# Patient Record
Sex: Female | Born: 1980 | Race: Black or African American | Hispanic: No | Marital: Single | Smoking: Never smoker
Health system: Southern US, Community
[De-identification: ages and names within clinical notes are randomized; demographics above are authoritative.]

## PROBLEM LIST (undated history)

## (undated) DIAGNOSIS — Z923 Personal history of irradiation: Secondary | ICD-10-CM

## (undated) DIAGNOSIS — D649 Anemia, unspecified: Secondary | ICD-10-CM

## (undated) DIAGNOSIS — L919 Hypertrophic disorder of the skin, unspecified: Secondary | ICD-10-CM

## (undated) HISTORY — PX: OTHER SURGICAL HISTORY: SHX169

## (undated) HISTORY — DX: Personal history of irradiation: Z92.3

---

## 2003-03-19 ENCOUNTER — Encounter: Payer: Self-pay | Admitting: Emergency Medicine

## 2003-03-19 ENCOUNTER — Emergency Department (HOSPITAL_COMMUNITY): Admission: EM | Admit: 2003-03-19 | Discharge: 2003-03-19 | Payer: Self-pay | Admitting: Emergency Medicine

## 2003-04-08 ENCOUNTER — Encounter: Payer: Self-pay | Admitting: Emergency Medicine

## 2003-04-08 ENCOUNTER — Emergency Department (HOSPITAL_COMMUNITY): Admission: EM | Admit: 2003-04-08 | Discharge: 2003-04-08 | Payer: Self-pay

## 2004-01-11 ENCOUNTER — Emergency Department (HOSPITAL_COMMUNITY): Admission: EM | Admit: 2004-01-11 | Discharge: 2004-01-11 | Payer: Self-pay | Admitting: Family Medicine

## 2009-01-08 ENCOUNTER — Emergency Department (HOSPITAL_COMMUNITY): Admission: EM | Admit: 2009-01-08 | Discharge: 2009-01-08 | Payer: Self-pay | Admitting: Emergency Medicine

## 2011-09-04 ENCOUNTER — Emergency Department (INDEPENDENT_AMBULATORY_CARE_PROVIDER_SITE_OTHER): Payer: Self-pay

## 2011-09-04 ENCOUNTER — Encounter (HOSPITAL_COMMUNITY): Payer: Self-pay

## 2011-09-04 ENCOUNTER — Emergency Department (INDEPENDENT_AMBULATORY_CARE_PROVIDER_SITE_OTHER)
Admission: EM | Admit: 2011-09-04 | Discharge: 2011-09-04 | Disposition: A | Discharge status: Home Health | Payer: Self-pay | Source: Home / Self Care | Attending: Family Medicine | Admitting: Family Medicine

## 2011-09-04 DIAGNOSIS — S92919A Unspecified fracture of unspecified toe(s), initial encounter for closed fracture: Secondary | ICD-10-CM

## 2011-09-04 HISTORY — DX: Morbid (severe) obesity due to excess calories: E66.01
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
## 2011-09-04 NOTE — Discharge Instructions (Signed)
Wear shoe for comfort as long as needed, ice and advil as needed for soreness, activity as tolerated.
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
## 2012-08-07 ENCOUNTER — Ambulatory Visit: Payer: Self-pay

## 2012-08-07 ENCOUNTER — Institutional Professional Consult (permissible substitution): Payer: Self-pay | Admitting: Radiation Oncology

## 2012-08-12 ENCOUNTER — Ambulatory Visit
Admission: RE | Admit: 2012-08-12 | Discharge: 2012-08-12 | Disposition: A | Discharge status: Home / Self Care | Payer: BC Managed Care – PPO | Source: Ambulatory Visit | Attending: Radiation Oncology | Admitting: Radiation Oncology

## 2012-08-12 ENCOUNTER — Encounter: Payer: Self-pay | Admitting: Radiation Oncology

## 2012-08-12 VITALS — BP 155/105 | HR 84 | Temp 98.4°F | Ht 63.5 in | Wt 384.0 lb

## 2012-08-12 DIAGNOSIS — L919 Hypertrophic disorder of the skin, unspecified: Secondary | ICD-10-CM

## 2012-08-12 DIAGNOSIS — L910 Hypertrophic scar: Secondary | ICD-10-CM | POA: Insufficient documentation

## 2012-08-12 HISTORY — DX: Hypertrophic disorder of the skin, unspecified: L91.9

## 2012-08-12 NOTE — Addendum Note (Signed)
Encounter addended by: Marquez Ceesay Marie Bralyn Folkert, RN on: 08/12/2012  5:03 PM<BR>     Documentation filed: Charges VN

## 2012-08-12 NOTE — Addendum Note (Signed)
Encounter addended by: Tessa Lerner, RN on: 08/12/2012  5:03 PM<BR>     Documentation filed: Charges VN

## 2012-08-12 NOTE — Progress Notes (Signed)
Please see the Nurse Progress Note in the MD Initial Consult Encounter for this patient. 

## 2012-08-12 NOTE — Progress Notes (Signed)
Patient here for consultation of keloids on left and right side of face.Initially started as what was thought to be acne approximately 3 years ago but after no response to cortisone injections and oral therapy the areas increased in size. Patient seen by Dr.Truesdale and referred here.To follow up with Dr.Truesdale on 08/29/12.No previous history of health problems or surgeries.Father and sister had history of keloids.

## 2012-08-12 NOTE — Addendum Note (Signed)
Encounter addended by: Tessa Lerner, RN on: 08/12/2012  5:02 PM<BR>     Documentation filed: Visit Diagnoses

## 2012-08-12 NOTE — Addendum Note (Signed)
Encounter addended by: Tessa Lerner, RN on: 08/12/2012  5:07 PM<BR>     Documentation filed: Charges VN

## 2012-08-12 NOTE — Progress Notes (Signed)
Radiation Oncology         [phone] ________________________________  Name: Christina [name] MRN: [id number]  Date: 08/12/2012  DOB: 07/29/1980  CC:No primary provider on file.  Louisa Second, MD     REFERRING PHYSICIAN: Louisa Second, MD   DIAGNOSIS: keloids of the face bilaterally  HISTORY OF PRESENT ILLNESS::Christina [name] is a 32 y.o. female who is seen for an initial consultation visit. The patient notes that she has had keloids for approximately 2-3 years. She does have a family history of keloids. The patient however was felt to have acne and she was treated as such. This included though some injections as well as some oral medications. Initially the keloids were present in the region of the left face but also began on the right as well in a similar distribution. The area on the left remains larger.  The patient after this was diagnosed as keloids was referred to Dr. Shon Hough. Certain areas are beginning to be moderately bulky at this point and he feels that surgical resection would be appropriate at this time. I have therefore been asked to see the patient for consideration of possible postoperative radiotherapy.  The patient complains of some symptoms related to the keloids at this time. She does complain of some pruritus as well as some soreness in these areas in conjunction with some tenderness.   PREVIOUS RADIATION THERAPY: No   PAST MEDICAL HISTORY:  has a past medical history of Morbid obesity and Hypertrophic condition of skin.     PAST SURGICAL HISTORY: Past Surgical History  Procedure Laterality Date  . No surgical history       FAMILY HISTORY: family history is not on file.   SOCIAL HISTORY:  reports that she has never smoked. She does not have any smokeless tobacco history on file. She reports that  drinks alcohol. She reports that she does not use illicit drugs.   ALLERGIES: Review of patient's allergies indicates no known  allergies.   MEDICATIONS:  No current outpatient prescriptions on file.   No current facility-administered medications for this encounter.     REVIEW OF SYSTEMS:  A 15 point review of systems is documented in the electronic medical record. This was obtained by the nursing staff. However, I reviewed this with the patient to discuss relevant findings and make appropriate changes.  Pertinent items are noted in HPI.    PHYSICAL EXAM:  height is 5' 3.5" (1.613 m) and weight is 384 lb (174.181 kg). Her temperature is 98.4 F (36.9 C). Her blood pressure is 155/105 and her pulse is 84.   Keloids her present along the left and right face in a fairly symmetrical manner. A begin in the mid facial region and extend posteriorly towards the ears but no involvement of the year on either side at this time. There are multiple keloids present on both sides with a mixture of more pedunculated areas as well as some flatter keloids.   LABORATORY DATA:  No results found for this basename: WBC, HGB, HCT, MCV, PLT   No results found for this basename: NA, K, CL, CO2   No results found for this basename: ALT, AST, GGT, ALKPHOS, BILITOT      RADIOGRAPHY: No results found.     IMPRESSION: The patient is a pleasant 32 year old female who has developed keloids on both sides of the face over the last several years. These areas have grown substantially and surgical excision is recommended at this time. I believe that postoperative radiotherapy  would be appropriate for her and would significantly reduce the chance of recurrence.  I discussed this with the patient. We discussed therefore the rationale of such a treatment. We also discussed the potential side effects and risks of treatment. In particular we discussed irritation of the skin and hyperpigmentation as well as the risk of secondary malignancy. All of her questions were answered. The patient indicated that she did wish to proceed with postoperative  radiotherapy after her upcoming excision.   PLAN: The patient is seeing Dr. Shon Hough later this month. We will followup on this and see when surgery is planned. I would like to have the patient come for a simulation either on the day of surgery or in the morning of postoperative day #1. As soon as possible with twice a day all. I discussed this with her and she was given contact information. I would anticipate at this time 4 treatments at 4 gray per fraction on an every other day basis.   I spent 35 minutes minutes face to face with the patient and more than 50% of that time was spent in counseling and/or coordination of care.    ________________________________   Radene Gunning, MD, PhD

## 2012-08-19 ENCOUNTER — Encounter: Payer: Self-pay | Admitting: Radiation Oncology

## 2012-09-04 ENCOUNTER — Encounter (HOSPITAL_BASED_OUTPATIENT_CLINIC_OR_DEPARTMENT_OTHER): Payer: Self-pay | Admitting: *Deleted

## 2012-09-04 NOTE — Progress Notes (Signed)
Chart reviewed by dr Sampson Goon, ok for surgery, will weigh pt on arrival

## 2012-09-09 ENCOUNTER — Ambulatory Visit (HOSPITAL_BASED_OUTPATIENT_CLINIC_OR_DEPARTMENT_OTHER)
Admission: RE | Admit: 2012-09-09 | Discharge: 2012-09-09 | Disposition: A | Discharge status: Home / Self Care | Payer: BC Managed Care – PPO | Source: Ambulatory Visit | Attending: Specialist | Admitting: Specialist

## 2012-09-09 ENCOUNTER — Ambulatory Visit (HOSPITAL_BASED_OUTPATIENT_CLINIC_OR_DEPARTMENT_OTHER): Payer: BC Managed Care – PPO | Admitting: Anesthesiology

## 2012-09-09 ENCOUNTER — Encounter (HOSPITAL_BASED_OUTPATIENT_CLINIC_OR_DEPARTMENT_OTHER): Payer: Self-pay | Admitting: *Deleted

## 2012-09-09 ENCOUNTER — Encounter (HOSPITAL_BASED_OUTPATIENT_CLINIC_OR_DEPARTMENT_OTHER): Payer: Self-pay | Admitting: Anesthesiology

## 2012-09-09 ENCOUNTER — Encounter (HOSPITAL_BASED_OUTPATIENT_CLINIC_OR_DEPARTMENT_OTHER)
Admission: RE | Disposition: A | Discharge status: Home / Self Care | Payer: Self-pay | Source: Ambulatory Visit | Attending: Specialist

## 2012-09-09 ENCOUNTER — Ambulatory Visit
Admission: RE | Admit: 2012-09-09 | Discharge: 2012-09-09 | Disposition: A | Discharge status: Home / Self Care | Payer: BC Managed Care – PPO | Source: Ambulatory Visit | Attending: Radiation Oncology | Admitting: Radiation Oncology

## 2012-09-09 DIAGNOSIS — Z510 Encounter for antineoplastic radiation therapy: Secondary | ICD-10-CM | POA: Insufficient documentation

## 2012-09-09 DIAGNOSIS — D649 Anemia, unspecified: Secondary | ICD-10-CM | POA: Insufficient documentation

## 2012-09-09 DIAGNOSIS — L910 Hypertrophic scar: Secondary | ICD-10-CM | POA: Insufficient documentation

## 2012-09-09 DIAGNOSIS — Z6841 Body Mass Index (BMI) 40.0 and over, adult: Secondary | ICD-10-CM | POA: Insufficient documentation

## 2012-09-09 HISTORY — DX: Anemia, unspecified: D64.9

## 2012-09-09 HISTORY — PX: STERIOD INJECTION: SHX5046

## 2012-09-09 HISTORY — PX: MASS EXCISION: SHX2000

## 2012-09-09 LAB — POCT HEMOGLOBIN-HEMACUE: Hemoglobin: 11.3 g/dL — ABNORMAL LOW (ref 12.0–15.0)

## 2012-09-09 SURGERY — EXCISION MASS
Anesthesia: General | Site: Face | Laterality: Left | Wound class: Clean

## 2012-09-09 MED ORDER — LIDOCAINE HCL (CARDIAC) 20 MG/ML IV SOLN
INTRAVENOUS | Status: DC | PRN
  Administered 2012-09-09: 60 mg via INTRAVENOUS

## 2012-09-09 MED ORDER — MIDAZOLAM HCL 2 MG/2ML IJ SOLN: 1.0000 mg | INTRAMUSCULAR | Status: DC | PRN

## 2012-09-09 MED ORDER — OXYCODONE HCL 5 MG PO TABS: 5.0000 mg | ORAL_TABLET | Freq: Once | ORAL | Status: DC | PRN

## 2012-09-09 MED ORDER — TRIAMCINOLONE ACETONIDE 40 MG/ML IJ SUSP
INTRAMUSCULAR | Status: DC | PRN
  Administered 2012-09-09: 5 mL via INTRAMUSCULAR

## 2012-09-09 MED ORDER — OXYCODONE HCL 5 MG/5ML PO SOLN: 5.0000 mg | Freq: Once | ORAL | Status: DC | PRN

## 2012-09-09 MED ORDER — DEXTROSE 5 % IV SOLN: 3.0000 g | Freq: Once | INTRAVENOUS | Status: DC

## 2012-09-09 MED ORDER — DEXAMETHASONE SODIUM PHOSPHATE 4 MG/ML IJ SOLN
INTRAMUSCULAR | Status: DC | PRN
  Administered 2012-09-09: 10 mg via INTRAVENOUS

## 2012-09-09 MED ORDER — HYDROMORPHONE HCL PF 1 MG/ML IJ SOLN: 0.2500 mg | INTRAMUSCULAR | Status: DC | PRN

## 2012-09-09 MED ORDER — FENTANYL CITRATE 0.05 MG/ML IJ SOLN
INTRAMUSCULAR | Status: DC | PRN
  Administered 2012-09-09: 100 ug via INTRAVENOUS

## 2012-09-09 MED ORDER — LACTATED RINGERS IV SOLN
INTRAVENOUS | Status: DC
  Administered 2012-09-09: 09:00:00 via INTRAVENOUS

## 2012-09-09 MED ORDER — CEFAZOLIN SODIUM-DEXTROSE 2-3 GM-% IV SOLR
2.0000 g | INTRAVENOUS | Status: DC
  Administered 2012-09-09: 3 g via INTRAVENOUS

## 2012-09-09 MED ORDER — PROMETHAZINE HCL 25 MG/ML IJ SOLN: 6.2500 mg | INTRAMUSCULAR | Status: DC | PRN

## 2012-09-09 MED ORDER — FENTANYL CITRATE 0.05 MG/ML IJ SOLN: 50.0000 ug | INTRAMUSCULAR | Status: DC | PRN

## 2012-09-09 MED ORDER — LIDOCAINE-EPINEPHRINE 0.5 %-1:200000 IJ SOLN
INTRAMUSCULAR | Status: DC | PRN
  Administered 2012-09-09: 50 mL

## 2012-09-09 MED ORDER — ONDANSETRON HCL 4 MG/2ML IJ SOLN
INTRAMUSCULAR | Status: DC | PRN
  Administered 2012-09-09: 4 mg via INTRAVENOUS

## 2012-09-09 MED ORDER — PROPOFOL 10 MG/ML IV BOLUS
INTRAVENOUS | Status: DC | PRN
  Administered 2012-09-09: 280 mg via INTRAVENOUS
  Administered 2012-09-09: 50 mg via INTRAVENOUS
  Administered 2012-09-09: 30 mg via INTRAVENOUS

## 2012-09-09 MED ORDER — MIDAZOLAM HCL 5 MG/5ML IJ SOLN
INTRAMUSCULAR | Status: DC | PRN
  Administered 2012-09-09: 2 mg via INTRAVENOUS

## 2012-09-09 SURGICAL SUPPLY — 65 items
APL SKNCLS STERI-STRIP NONHPOA (GAUZE/BANDAGES/DRESSINGS) ×2
BAG DECANTER FOR FLEXI CONT (MISCELLANEOUS) ×3 IMPLANT
BALL CTTN LRG ABS STRL LF (GAUZE/BANDAGES/DRESSINGS)
BANDAGE ELASTIC 4 VELCRO ST LF (GAUZE/BANDAGES/DRESSINGS) IMPLANT
BANDAGE GAUZE ELAST BULKY 4 IN (GAUZE/BANDAGES/DRESSINGS) IMPLANT
BENZOIN TINCTURE PRP APPL 2/3 (GAUZE/BANDAGES/DRESSINGS) ×1 IMPLANT
BLADE KNIFE PERSONA 10 (BLADE) ×3 IMPLANT
BLADE KNIFE PERSONA 15 (BLADE) ×3 IMPLANT
BNDG COHESIVE 4X5 TAN STRL (GAUZE/BANDAGES/DRESSINGS) IMPLANT
CANISTER SUCTION 1200CC (MISCELLANEOUS) IMPLANT
CLEANER CAUTERY TIP 5X5 PAD (MISCELLANEOUS) ×1 IMPLANT
CLOTH BEACON ORANGE TIMEOUT ST (SAFETY) ×3 IMPLANT
COTTONBALL LRG STERILE PKG (GAUZE/BANDAGES/DRESSINGS) IMPLANT
COVER MAYO STAND STRL (DRAPES) ×3 IMPLANT
COVER TABLE BACK 60X90 (DRAPES) ×3 IMPLANT
DRAPE LAPAROSCOPIC ABDOMINAL (DRAPES) IMPLANT
DRAPE U-SHAPE 76X120 STRL (DRAPES) ×1 IMPLANT
DRSG PAD ABDOMINAL 8X10 ST (GAUZE/BANDAGES/DRESSINGS) IMPLANT
ELECT NDL TIP 2.8 STRL (NEEDLE) ×1 IMPLANT
ELECT NEEDLE TIP 2.8 STRL (NEEDLE) ×3 IMPLANT
ELECT REM PT RETURN 9FT ADLT (ELECTROSURGICAL) ×3
ELECTRODE REM PT RTRN 9FT ADLT (ELECTROSURGICAL) ×2 IMPLANT
FILTER 7/8 IN (FILTER) ×1 IMPLANT
GAUZE SPONGE 4X4 12PLY STRL LF (GAUZE/BANDAGES/DRESSINGS) ×1 IMPLANT
GAUZE SPONGE 4X4 16PLY XRAY LF (GAUZE/BANDAGES/DRESSINGS) IMPLANT
GAUZE XEROFORM 1X8 LF (GAUZE/BANDAGES/DRESSINGS) ×3 IMPLANT
GAUZE XEROFORM 5X9 LF (GAUZE/BANDAGES/DRESSINGS) ×1 IMPLANT
GLOVE BIO SURGEON STRL SZ 6.5 (GLOVE) ×3 IMPLANT
GLOVE BIOGEL M STRL SZ7.5 (GLOVE) ×3 IMPLANT
GLOVE BIOGEL PI IND STRL 7.0 (GLOVE) ×1 IMPLANT
GLOVE BIOGEL PI IND STRL 8 (GLOVE) ×2 IMPLANT
GLOVE BIOGEL PI INDICATOR 7.0 (GLOVE) ×1
GLOVE BIOGEL PI INDICATOR 8 (GLOVE) ×1
GLOVE ECLIPSE 7.0 STRL STRAW (GLOVE) ×3 IMPLANT
GLOVE SKINSENSE NS SZ6.5 (GLOVE) ×1
GLOVE SKINSENSE STRL SZ6.5 (GLOVE) IMPLANT
GOWN PREVENTION PLUS XLARGE (GOWN DISPOSABLE) IMPLANT
GOWN PREVENTION PLUS XXLARGE (GOWN DISPOSABLE) ×4 IMPLANT
NDL HYPO 25X1 1.5 SAFETY (NEEDLE) ×2 IMPLANT
NEEDLE HYPO 25X1 1.5 SAFETY (NEEDLE) ×9 IMPLANT
PACK BASIN DAY SURGERY FS (CUSTOM PROCEDURE TRAY) ×3 IMPLANT
PAD CLEANER CAUTERY TIP 5X5 (MISCELLANEOUS)
PENCIL BUTTON HOLSTER BLD 10FT (ELECTRODE) ×3 IMPLANT
SHEET MEDIUM DRAPE 40X70 STRL (DRAPES) ×3 IMPLANT
SHEETING SILICONE GEL EPI DERM (MISCELLANEOUS) ×1 IMPLANT
SPONGE GAUZE 4X4 12PLY (GAUZE/BANDAGES/DRESSINGS) IMPLANT
SPONGE LAP 18X18 X RAY DECT (DISPOSABLE) ×3 IMPLANT
STAPLER VISISTAT 35W (STAPLE) IMPLANT
STOCKINETTE 4X48 STRL (DRAPES) IMPLANT
STRIP CLOSURE SKIN 1/2X4 (GAUZE/BANDAGES/DRESSINGS) IMPLANT
STRIP SUTURE WOUND CLOSURE 1/2 (SUTURE) ×2 IMPLANT
SUCTION FRAZIER TIP 10 FR DISP (SUCTIONS) IMPLANT
SUT MNCRL AB 3-0 PS2 18 (SUTURE) ×2 IMPLANT
SUT PROLENE 4 0 P 3 18 (SUTURE) IMPLANT
SUT PROLENE 4 0 PS 2 18 (SUTURE) ×1 IMPLANT
SUT SILK 3 0 PS 1 (SUTURE) IMPLANT
SYR CONTROL 10ML LL (SYRINGE) ×4 IMPLANT
TAPE HYPAFIX 6X30 (GAUZE/BANDAGES/DRESSINGS) IMPLANT
TOWEL OR 17X24 6PK STRL BLUE (TOWEL DISPOSABLE) ×6 IMPLANT
TRAY DSU PREP LF (CUSTOM PROCEDURE TRAY) ×3 IMPLANT
TUBE CONNECTING 20X1/4 (TUBING) IMPLANT
UNDERPAD 30X30 INCONTINENT (UNDERPADS AND DIAPERS) ×1 IMPLANT
VAC PENCILS W/TUBING CLEAR (MISCELLANEOUS) ×2 IMPLANT
WATER STERILE IRR 1000ML POUR (IV SOLUTION) ×3 IMPLANT
YANKAUER SUCT BULB TIP NO VENT (SUCTIONS) ×3 IMPLANT

## 2012-09-09 NOTE — H&P (Signed)
Christina [name] is an 32 y.o. female.   Chief Complaint:Keloidosis right and left face HPI: Increased growth itching and burning not responsive to conservative treatment  Past Medical History  Diagnosis Date  . Morbid obesity   . Hypertrophic condition of skin     Right/Left side of face  . Anemia     Past Surgical History  Procedure Laterality Date  . No surgical history      History reviewed. No pertinent family history. Social History:  reports that she has never smoked. She does not have any smokeless tobacco history on file. She reports that  drinks alcohol. She reports that she does not use illicit drugs.  Allergies: No Known Allergies  No prescriptions prior to admission    No results found for this or any previous visit (from the past 48 hour(s)). No results found.  Review of Systems  Constitutional: Negative.   HENT: Negative.   Eyes: Negative.   Respiratory: Negative.   Cardiovascular: Negative.   Gastrointestinal: Negative.   Genitourinary: Negative.   Musculoskeletal: Negative.   Skin: Positive for itching.  Neurological: Negative.   Endo/Heme/Allergies: Negative.   Psychiatric/Behavioral: Negative.     Height 5\' 3"  (1.6 m), weight 174.635 kg (385 lb), last menstrual period 08/19/2012. Physical Exam   Assessment/Plan  Serial excision starting with left face TRUESDALE,GERALD L 09/09/2012, 7:31 AM

## 2012-09-09 NOTE — Anesthesia Preprocedure Evaluation (Signed)
Anesthesia Evaluation  Patient identified by MRN, date of birth, ID band Patient awake    Reviewed: Allergy & Precautions, H&P , NPO status   Airway Mallampati: II  Neck ROM: Full    Dental   Pulmonary  breath sounds clear to auscultation        Cardiovascular Rhythm:Regular Rate:Normal     Neuro/Psych    GI/Hepatic   Endo/Other  Morbid obesity  Renal/GU      Musculoskeletal   Abdominal (+) + obese,   Peds  Hematology   Anesthesia Other Findings   Reproductive/Obstetrics                           Anesthesia Physical Anesthesia Plan  ASA: III  Anesthesia Plan: General   Post-op Pain Management:    Induction: Intravenous  Airway Management Planned: Oral ETT  Additional Equipment:   Intra-op Plan:   Post-operative Plan: Extubation in OR  Informed Consent: I have reviewed the patients History and Physical, chart, labs and discussed the procedure including the risks, benefits and alternatives for the proposed anesthesia with the patient or authorized representative who has indicated his/her understanding and acceptance.     Plan Discussed with: CRNA and Surgeon  Anesthesia Plan Comments:         Anesthesia Quick Evaluation

## 2012-09-09 NOTE — Transfer of Care (Signed)
Immediate Anesthesia Transfer of Care Note  Patient: Christina [name]  Procedure(s) Performed: Procedure(s): EXCISION KELOIDS LEFT FACE WITH FLAP RECONSTRUCTION (Left) STEROID INJECTION (Bilateral)  Patient Location: PACU  Anesthesia Type:General  Level of Consciousness: awake, alert  and oriented  Airway & Oxygen Therapy: Patient Spontanous Breathing and Patient connected to face mask oxygen  Post-op Assessment: Report given to PACU RN and Post -op Vital signs reviewed and stable  Post vital signs: Reviewed and stable  Complications: No apparent anesthesia complications

## 2012-09-09 NOTE — Anesthesia Procedure Notes (Signed)
Procedure Name: LMA Insertion Performed by: York Grice Pre-anesthesia Checklist: Patient identified, Timeout performed, Emergency Drugs available, Suction available and Patient being monitored Patient Re-evaluated:Patient Re-evaluated prior to inductionOxygen Delivery Method: Circle system utilized Preoxygenation: Pre-oxygenation with 100% oxygen Intubation Type: IV induction Ventilation: Mask ventilation without difficulty LMA: LMA with gastric port inserted LMA Size: 4.0 Number of attempts: 1 Placement Confirmation: breath sounds checked- equal and bilateral Tube secured with: Tape Dental Injury: Teeth and Oropharynx as per pre-operative assessment

## 2012-09-09 NOTE — Brief Op Note (Signed)
09/09/2012  10:43 AM  PATIENT:  Christina [name]  32 y.o. female  PRE-OPERATIVE DIAGNOSIS:  LEFT EXCISION OF KELOIDS  POST-OPERATIVE DIAGNOSIS:  LEFT EXCISION OF KELOIDS Keloid mass right face  PROCEDURE:  Procedure(s): EXCISION KELOIDS LEFT FACE WITH FLAP RECONSTRUCTION (Left) STEROID INJECTION (Bilateral)  SURGEON:  Surgeon(s) and Role:    * Louisa Second, MD - Primary  PHYSICIAN ASSISTANT:   ASSISTANTS: none   ANESTHESIA:   general  EBL:  Total I/O In: 1740 [P.O.:240; I.V.:1500] Out: -   BLOOD ADMINISTERED:none  DRAINS: none   LOCAL MEDICATIONS USED:  LIDOCAINE   SPECIMEN:  Excision  DISPOSITION OF SPECIMEN:  PATHOLOGY  COUNTS:  YES  TOURNIQUET:  * No tourniquets in log *  DICTATION: .Other Dictation: Dictation Number [id number]  PLAN OF CARE: Discharge to home after PACU  PATIENT DISPOSITION:  PACU - hemodynamically stable.   Delay start of Pharmacological VTE agent (>24hrs) due to surgical blood loss or risk of bleeding: yes

## 2012-09-09 NOTE — Anesthesia Postprocedure Evaluation (Signed)
  Anesthesia Post-op Note  Patient: Christina [name]  Procedure(s) Performed: Procedure(s): EXCISION KELOIDS LEFT FACE WITH FLAP RECONSTRUCTION (Left) STEROID INJECTION (Bilateral)  Patient Location: PACU  Anesthesia Type:General  Level of Consciousness: awake  Airway and Oxygen Therapy: Patient Spontanous Breathing  Post-op Pain: mild  Post-op Assessment: Post-op Vital signs reviewed, Patient's Cardiovascular Status Stable, Respiratory Function Stable, Patent Airway, No signs of Nausea or vomiting and Pain level controlled  Post-op Vital Signs: stable  Complications: No apparent anesthesia complications

## 2012-09-10 ENCOUNTER — Encounter (HOSPITAL_BASED_OUTPATIENT_CLINIC_OR_DEPARTMENT_OTHER): Payer: Self-pay | Admitting: Specialist

## 2012-09-10 ENCOUNTER — Ambulatory Visit
Admission: RE | Admit: 2012-09-10 | Discharge: 2012-09-10 | Disposition: A | Discharge status: Home / Self Care | Payer: BC Managed Care – PPO | Source: Ambulatory Visit | Attending: Radiation Oncology | Admitting: Radiation Oncology

## 2012-09-10 NOTE — Progress Notes (Signed)
Patient here for dressing change post radiation to keloid to left side of face.Incision looks good, no drainage or swelling.Denies pain.Telfa applied and secured with paper tape.Reviewed treatment schedule to be every other day .Remaining treatments on Thursday, Monday and Wednesday.

## 2012-09-10 NOTE — Op Note (Signed)
Christina, [name] NO.:  [id number]  MEDICAL RECORD NO.:  [id number]  LOCATION:                               FACILITY:  [facility]  PHYSICIAN:  Earvin Hansen L. Shon Hough, M.D.DATE OF BIRTH:  [birth year]  DATE OF PROCEDURE:  09/09/2012 DATE OF DISCHARGE:  09/09/2012                              OPERATIVE REPORT   The patient with history of severe keloidosis involving the right and left facial areas.  Predominantly on the left side with approximately 4 pedunculated keloidosis areas with increased pain, discomfort, itching, and burning.  PROCEDURES DONE:  Excision of conglomerate areas and 1 large excision with a rotational flap coverage.  Excision of area in the preauricular region in the same technique of closure.  ANESTHESIA:  General.  The patient underwent general anesthesia, intubated orally.  Prep was done to the face, neck areas with Betadine solution, walled off with sterile towels, and drapes, so as to make a sterile field.  Marcaine was used to outline the excision of the areas of large elliptical, circular shape of both places with a large pedunculated keloid on the mid portion.  This whole area measured approximately 4 to 5 inches in length.  Xylocaine 0.5% with epinephrine was injected locally for vasoconstriction for a total of 40 mL 100 to 200,000 concentration. Excision was made down the underlying subcutaneous tissue.  Next, the superior and inferior flap was freed up significantly and approximately 4 to 5 cm were placed.  Rotational flap coverage was done.  Deep sutures of 2-0 Monocryl x2 levels, subdermal suture of 3-0 Monocryl, then a running subcuticular stitch of 3-0 Monocryl throughout the area.  Same was done of the lesion and the preauricular area measured approximately 2 cm in length.  The wounds were cleansed.  Steri-Strips are all dressed, applied all areas as patient has been prepared for postoperative low-dose radiation treatment.  She  has had a preoperative consultation at [facility] and we received 3 treatments in hopes to reduce the potential recurrence of keloidosis.  ESTIMATED BLOOD LOSS:  Less than 75, 100 mL.  COMPLICATIONS:  None.     Yaakov Guthrie. Shon Hough, M.D.     Cathie Hoops  D:  09/09/2012  T:  09/10/2012  Job:  [id number]

## 2012-09-11 ENCOUNTER — Ambulatory Visit: Payer: BC Managed Care – PPO

## 2012-09-12 ENCOUNTER — Ambulatory Visit
Admission: RE | Admit: 2012-09-12 | Discharge: 2012-09-12 | Disposition: A | Discharge status: Home / Self Care | Payer: BC Managed Care – PPO | Source: Ambulatory Visit | Attending: Radiation Oncology | Admitting: Radiation Oncology

## 2012-09-12 ENCOUNTER — Encounter: Payer: Self-pay | Admitting: Radiation Oncology

## 2012-09-12 VITALS — BP 126/86 | HR 66 | Temp 97.7°F | Wt 382.7 lb

## 2012-09-12 DIAGNOSIS — L910 Hypertrophic scar: Secondary | ICD-10-CM

## 2012-09-12 NOTE — Progress Notes (Signed)
   Department of Radiation Oncology  Phone:  [phone] Fax:        [phone]  Weekly Treatment Note    Name: Christina [name] Date: 09/12/2012 MRN: [id number] DOB: [birth year]   Current dose: 8 Gy  Current fraction: 2   MEDICATIONS: No current outpatient prescriptions on file.   No current facility-administered medications for this encounter.     ALLERGIES: Review of patient's allergies indicates no known allergies.   LABORATORY DATA:  Lab Results  Component Value Date   HGB 11.3* 09/09/2012   No results found for this basename: NA, K, CL, CO2   No results found for this basename: ALT, AST, GGT, ALKPHOS, BILITOT     NARRATIVE: Christina [name] was seen today for weekly treatment management. The chart was checked and the patient's films were reviewed. The patient is doing very well. No complaints at this time. No skin irritation and she is done well since surgery.  PHYSICAL EXAMINATION: weight is 382 lb 11.2 oz (173.592 kg). Her temperature is 97.7 F (36.5 C). Her blood pressure is 126/86 and her pulse is 66.      no significant skin change in the affected area.  ASSESSMENT: The patient is doing satisfactorily with treatment.  PLAN: We will continue with the patient's radiation treatment as planned.

## 2012-09-12 NOTE — Progress Notes (Signed)
  Radiation Oncology         [phone] ________________________________  Name: Christina [name] MRN: [id number]  Date: 09/09/2012  DOB: 1980-07-14  SIMULATION AND TREATMENT PLANNING NOTE  DIAGNOSIS:  Keloid of the left face status post excision  NARRATIVE:  The patient was brought to the linear accelerator suite.  Identity was confirmed.  All relevant records and images related to the planned course of therapy were reviewed.   Written consent to proceed with treatment was confirmed which was freely given after reviewing the details related to the planned course of therapy had been reviewed with the patient.  Then, the patient was set-up in a stable reproducible  prone position for radiation therapy.   Surface markings were placed outlining the target area.    Treatment planning then occurred and the block was designed corresponding to the target area.  The radiation prescription was entered and confirmed.  A total of 1 complex treatment devices were fabricated which relate to the designed radiation treatment fields. Each of these customized fields/ complex treatment devices will be used on a daily basis during the radiation course. I have requested : Electron calculations and a special port plan.   PLAN:  The patient will receive 16 Gy in 4 fractions on an every other day basis.  ________________________________   Radene Gunning, MD, PhD

## 2012-09-12 NOTE — Progress Notes (Signed)
Patient here for routine check and dressing change to left side of face.Incision intact without swelling, redness or drainage.Denies pain currently but does have prescription pain medication to take if needed.Will bring on next visit.

## 2012-09-13 ENCOUNTER — Ambulatory Visit: Payer: BC Managed Care – PPO

## 2012-09-16 ENCOUNTER — Ambulatory Visit
Admission: RE | Admit: 2012-09-16 | Discharge: 2012-09-16 | Disposition: A | Discharge status: Home / Self Care | Payer: BC Managed Care – PPO | Source: Ambulatory Visit | Attending: Radiation Oncology | Admitting: Radiation Oncology

## 2012-09-16 NOTE — Progress Notes (Signed)
Patient brought to nursing after radiation completed for the day.Dry crusted drainage cleaned from inner and outside of ear otherwise left facial incision healing without redness, swelling or drainage.Left face incision covered with telfa and secured with papertape.Marland Kitchen

## 2012-09-17 ENCOUNTER — Ambulatory Visit: Payer: BC Managed Care – PPO

## 2012-09-18 ENCOUNTER — Ambulatory Visit
Admission: RE | Admit: 2012-09-18 | Discharge: 2012-09-18 | Disposition: A | Discharge status: Home / Self Care | Payer: BC Managed Care – PPO | Source: Ambulatory Visit | Attending: Radiation Oncology | Admitting: Radiation Oncology

## 2012-09-18 ENCOUNTER — Encounter: Payer: Self-pay | Admitting: Radiation Oncology

## 2012-09-18 VITALS — BP 146/74 | HR 76 | Temp 97.8°F | Wt 376.6 lb

## 2012-09-18 DIAGNOSIS — L910 Hypertrophic scar: Secondary | ICD-10-CM

## 2012-09-18 NOTE — Progress Notes (Addendum)
Cleaned left facial keloid scar with sterile normal saline and then covered with telfa dressing.Patient knows to change telfa daily and follow direction of Dr.Trusdale when seen on 09/23/12.

## 2012-09-18 NOTE — Progress Notes (Signed)
   Weekly Management Note:  outpatient Current Dose:  16 Gy  Projected Dose:16 Gy   Narrative:  The patient presents for routine under treatment assessment.  CBCT/MVCT images/Port film x-rays were reviewed.  The chart was checked. Complete RT for keloid with no complaints.  Physical Findings:  weight is 376 lb 9.6 oz (170.825 kg). Her temperature is 97.8 F (36.6 C). Her blood pressure is 146/74 and her pulse is 76. Her oxygen saturation is 99%.  Scar without signs of infection over left face.  Impression:  The patient has tolerated radiotherapy.  Plan:  Continue radiotherapy as planned. F/u in 1 mo  ________________________________   Lonie Peak, M.D.

## 2012-09-18 NOTE — Progress Notes (Signed)
Patient completes radiation treatments to left facial keloid scar.Incision intact with ster-strips.No pain, swelling or drainage.Has 2 more days of daily anti-biotic therapy remaining prescribed by Dr.Trusdale.One month follow up scheduled for Dr.Moody and to see Dr.Trusdale on Monday 09/23/2012.Telfa and paper tape given to cover incision.

## 2012-09-19 ENCOUNTER — Ambulatory Visit: Payer: BC Managed Care – PPO

## 2012-09-20 ENCOUNTER — Ambulatory Visit: Payer: BC Managed Care – PPO

## 2012-09-23 ENCOUNTER — Ambulatory Visit: Payer: BC Managed Care – PPO

## 2012-09-26 NOTE — Progress Notes (Signed)
  Radiation Oncology         [phone] ________________________________  Name: Christina [name] MRN: [id number]  Date: 09/18/2012  DOB: 03-Nov-1980  End of Treatment Note  Diagnosis:   Keloid of the left facial region     Indication for treatment:  Curative       Radiation treatment dates:   09/10/2012 through 09/18/2012  Site/dose:   The patient was treated to the surgical scar plus margin in the left facial region. This was completed postoperatively to a dose of 16 gray in 4 fractions at 4 gray per fraction on an every other day basis. This treatment was carried out with a single en face customized electron field.  Narrative: The patient tolerated radiation treatment relatively well.   She did not exhibit any significant difficulties with acute toxicity during her treatment. No significant skin reaction at the end of treatment. The surgical scar appear to be healing well.  Plan: The patient has completed radiation treatment. The patient will return to radiation oncology clinic for routine followup in one month. I advised the patient to call or return sooner if they have any questions or concerns related to their recovery or treatment. ________________________________  Radene Gunning, M.D., Ph.D.

## 2012-09-26 NOTE — Progress Notes (Signed)
  Radiation Oncology         [phone] ________________________________  Name: Christina [name] MRN: [id number]  Date: 08/19/2012  DOB: 1980-12-23  Simulation Verification Note   NARRATIVE: The patient was brought to the treatment unit and placed in the planned treatment position. The clinical setup was verified. Then port films were obtained and uploaded to the radiation oncology medical record software.  The treatment beams were carefully compared against the planned radiation fields. The position, location, and shape of the radiation fields was reviewed. The targeted volume of tissue appears to be appropriately covered by the radiation beams. Based on my personal review, I approved the simulation verification. The patient's treatment will proceed as planned.  ________________________________   Radene Gunning, MD, PhD

## 2012-10-17 ENCOUNTER — Ambulatory Visit: Payer: BC Managed Care – PPO | Admitting: Radiation Oncology

## 2012-11-13 ENCOUNTER — Encounter: Payer: Self-pay | Admitting: Oncology

## 2012-11-14 ENCOUNTER — Ambulatory Visit
Admission: RE | Admit: 2012-11-14 | Discharge: 2012-11-14 | Disposition: A | Discharge status: Home / Self Care | Payer: BC Managed Care – PPO | Source: Ambulatory Visit | Attending: Radiation Oncology | Admitting: Radiation Oncology

## 2012-11-14 VITALS — BP 106/85 | HR 92 | Temp 97.6°F | Ht 63.0 in | Wt 377.3 lb

## 2012-11-14 DIAGNOSIS — L910 Hypertrophic scar: Secondary | ICD-10-CM

## 2012-11-14 NOTE — Progress Notes (Signed)
Christina [name] here for follow up after 4 fractions to her left facial region.  She denies pain today.  She did have pain in her lower jaw area two weeks ago that lasted 2 days.  The skin on her left facial area is intact without any redness.

## 2012-11-14 NOTE — Progress Notes (Signed)
  Radiation Oncology         [phone] ________________________________  Name: Christina [name] MRN: [id number]  Date: 11/14/2012  DOB: [birth year]  Follow-Up Visit Note  CC: No primary provider on file.  Louisa Second, MD  Diagnosis:   Keloid of the left facial region  Interval Since Last Radiation:  2 months   Narrative:  The patient returns today for routine follow-up.  The patient is doing well at this time. She states that the skin in the treatment area [place] after her treatment but this has been fading well. She is pleased with how the surgical site is doing. No real complaints in this area. No pain today.                              ALLERGIES:  has No Known Allergies.  Meds: No current outpatient prescriptions on file.   No current facility-administered medications for this encounter.    Physical Findings: The patient is in no acute distress. Patient is alert and oriented.  height is 5\' 3"  (1.6 m) and weight is 377 lb 4.8 oz (171.142 kg). Her temperature is 97.6 F (36.4 C). Her blood pressure is 106/85 and her pulse is 92. .   The surgical site looks good. No keloid formation. Minimal hyperpigmentation.  Lab Findings: Lab Results  Component Value Date   HGB 11.3* 09/09/2012     Radiographic Findings: No results found.  Impression/Plan:    The patient is doing very well 2 months after completing surgery and postoperative radiotherapy for a keloid along the left face. The patient is planning to proceed with surgery to remove keloids along the right face/jaw line. The patient is scheduled for surgery to remove keloids in the right facial region/jaw line.  She has a scheduled for the morning of 12/23/2012. We will bring her to clinic later that day for treatment planning with the plan to start her the following day on 12/24/2012.    Radene Gunning, M.D., Ph.D.

## 2012-12-18 ENCOUNTER — Encounter (HOSPITAL_BASED_OUTPATIENT_CLINIC_OR_DEPARTMENT_OTHER): Payer: Self-pay | Admitting: *Deleted

## 2012-12-23 ENCOUNTER — Ambulatory Visit (HOSPITAL_BASED_OUTPATIENT_CLINIC_OR_DEPARTMENT_OTHER)
Admission: RE | Admit: 2012-12-23 | Discharge: 2012-12-23 | Disposition: A | Discharge status: Home / Self Care | Payer: BC Managed Care – PPO | Source: Ambulatory Visit | Attending: Specialist | Admitting: Specialist

## 2012-12-23 ENCOUNTER — Encounter (HOSPITAL_BASED_OUTPATIENT_CLINIC_OR_DEPARTMENT_OTHER)
Admission: RE | Disposition: A | Discharge status: Home / Self Care | Payer: Self-pay | Source: Ambulatory Visit | Attending: Specialist

## 2012-12-23 ENCOUNTER — Encounter (HOSPITAL_BASED_OUTPATIENT_CLINIC_OR_DEPARTMENT_OTHER): Payer: Self-pay | Admitting: Anesthesiology

## 2012-12-23 ENCOUNTER — Encounter (HOSPITAL_BASED_OUTPATIENT_CLINIC_OR_DEPARTMENT_OTHER): Payer: Self-pay | Admitting: *Deleted

## 2012-12-23 ENCOUNTER — Ambulatory Visit: Payer: BC Managed Care – PPO | Admitting: Radiation Oncology

## 2012-12-23 ENCOUNTER — Ambulatory Visit (HOSPITAL_BASED_OUTPATIENT_CLINIC_OR_DEPARTMENT_OTHER): Payer: BC Managed Care – PPO | Admitting: Anesthesiology

## 2012-12-23 DIAGNOSIS — Z923 Personal history of irradiation: Secondary | ICD-10-CM | POA: Insufficient documentation

## 2012-12-23 DIAGNOSIS — D649 Anemia, unspecified: Secondary | ICD-10-CM | POA: Insufficient documentation

## 2012-12-23 DIAGNOSIS — L910 Hypertrophic scar: Secondary | ICD-10-CM | POA: Insufficient documentation

## 2012-12-23 HISTORY — PX: MASS EXCISION: SHX2000

## 2012-12-23 SURGERY — EXCISION MASS
Anesthesia: General | Site: Face | Laterality: Right | Wound class: Clean

## 2012-12-23 MED ORDER — PROMETHAZINE HCL 25 MG/ML IJ SOLN: 6.2500 mg | INTRAMUSCULAR | Status: DC | PRN

## 2012-12-23 MED ORDER — OXYCODONE HCL 5 MG/5ML PO SOLN: 5.0000 mg | Freq: Once | ORAL | Status: AC | PRN

## 2012-12-23 MED ORDER — DEXAMETHASONE SODIUM PHOSPHATE 4 MG/ML IJ SOLN
INTRAMUSCULAR | Status: DC | PRN
  Administered 2012-12-23: 10 mg via INTRAVENOUS

## 2012-12-23 MED ORDER — MIDAZOLAM HCL 5 MG/5ML IJ SOLN
INTRAMUSCULAR | Status: DC | PRN
  Administered 2012-12-23: 2 mg via INTRAVENOUS

## 2012-12-23 MED ORDER — SUCCINYLCHOLINE CHLORIDE 20 MG/ML IJ SOLN
INTRAMUSCULAR | Status: DC | PRN
  Administered 2012-12-23: 140 mg via INTRAVENOUS

## 2012-12-23 MED ORDER — OXYCODONE HCL 5 MG PO TABS
5.0000 mg | ORAL_TABLET | Freq: Once | ORAL | Status: AC | PRN
  Administered 2012-12-23: 5 mg via ORAL

## 2012-12-23 MED ORDER — PROPOFOL 10 MG/ML IV BOLUS
INTRAVENOUS | Status: DC | PRN
  Administered 2012-12-23: 60 mg via INTRAVENOUS
  Administered 2012-12-23: 200 mg via INTRAVENOUS

## 2012-12-23 MED ORDER — FENTANYL CITRATE 0.05 MG/ML IJ SOLN
INTRAMUSCULAR | Status: DC | PRN
  Administered 2012-12-23: 100 ug via INTRAVENOUS

## 2012-12-23 MED ORDER — HYDROMORPHONE HCL PF 1 MG/ML IJ SOLN: 0.2500 mg | INTRAMUSCULAR | Status: DC | PRN

## 2012-12-23 MED ORDER — FENTANYL CITRATE 0.05 MG/ML IJ SOLN: 50.0000 ug | INTRAMUSCULAR | Status: DC | PRN

## 2012-12-23 MED ORDER — CEFAZOLIN SODIUM-DEXTROSE 2-3 GM-% IV SOLR
2.0000 g | INTRAVENOUS | Status: AC
  Administered 2012-12-23: 3 g via INTRAVENOUS

## 2012-12-23 MED ORDER — MIDAZOLAM HCL 2 MG/2ML IJ SOLN: 1.0000 mg | INTRAMUSCULAR | Status: DC | PRN

## 2012-12-23 MED ORDER — ONDANSETRON HCL 4 MG/2ML IJ SOLN
INTRAMUSCULAR | Status: DC | PRN
  Administered 2012-12-23: 4 mg via INTRAVENOUS

## 2012-12-23 MED ORDER — LIDOCAINE HCL 4 % MT SOLN
OROMUCOSAL | Status: DC | PRN
  Administered 2012-12-23: 4 mL via TOPICAL

## 2012-12-23 MED ORDER — LACTATED RINGERS IV SOLN
INTRAVENOUS | Status: DC
  Administered 2012-12-23 (×2): via INTRAVENOUS

## 2012-12-23 MED ORDER — LIDOCAINE-EPINEPHRINE 0.5 %-1:200000 IJ SOLN
INTRAMUSCULAR | Status: DC | PRN
  Administered 2012-12-23: 50 mL

## 2012-12-23 SURGICAL SUPPLY — 64 items
APL SKNCLS STERI-STRIP NONHPOA (GAUZE/BANDAGES/DRESSINGS)
BAG DECANTER FOR FLEXI CONT (MISCELLANEOUS) ×1 IMPLANT
BALL CTTN LRG ABS STRL LF (GAUZE/BANDAGES/DRESSINGS)
BANDAGE ELASTIC 4 VELCRO ST LF (GAUZE/BANDAGES/DRESSINGS) IMPLANT
BANDAGE GAUZE ELAST BULKY 4 IN (GAUZE/BANDAGES/DRESSINGS) IMPLANT
BENZOIN TINCTURE PRP APPL 2/3 (GAUZE/BANDAGES/DRESSINGS) IMPLANT
BLADE KNIFE PERSONA 10 (BLADE) ×2 IMPLANT
BLADE KNIFE PERSONA 15 (BLADE) ×2 IMPLANT
BNDG COHESIVE 4X5 TAN STRL (GAUZE/BANDAGES/DRESSINGS) IMPLANT
CANISTER SUCTION 1200CC (MISCELLANEOUS) ×1 IMPLANT
CLEANER CAUTERY TIP 5X5 PAD (MISCELLANEOUS) ×1 IMPLANT
CLOTH BEACON ORANGE TIMEOUT ST (SAFETY) ×2 IMPLANT
COTTONBALL LRG STERILE PKG (GAUZE/BANDAGES/DRESSINGS) IMPLANT
COVER MAYO STAND STRL (DRAPES) ×2 IMPLANT
COVER TABLE BACK 60X90 (DRAPES) ×2 IMPLANT
DRAPE LAPAROSCOPIC ABDOMINAL (DRAPES) IMPLANT
DRAPE U-SHAPE 76X120 STRL (DRAPES) ×1 IMPLANT
DRSG PAD ABDOMINAL 8X10 ST (GAUZE/BANDAGES/DRESSINGS) IMPLANT
ELECT NDL TIP 2.8 STRL (NEEDLE) ×1 IMPLANT
ELECT NEEDLE TIP 2.8 STRL (NEEDLE) ×2 IMPLANT
ELECT REM PT RETURN 9FT ADLT (ELECTROSURGICAL) ×2
ELECTRODE REM PT RTRN 9FT ADLT (ELECTROSURGICAL) ×1 IMPLANT
FILTER 7/8 IN (FILTER) ×1 IMPLANT
GAUZE SPONGE 4X4 12PLY STRL LF (GAUZE/BANDAGES/DRESSINGS) IMPLANT
GAUZE SPONGE 4X4 16PLY XRAY LF (GAUZE/BANDAGES/DRESSINGS) IMPLANT
GAUZE XEROFORM 1X8 LF (GAUZE/BANDAGES/DRESSINGS) ×2 IMPLANT
GAUZE XEROFORM 5X9 LF (GAUZE/BANDAGES/DRESSINGS) ×1 IMPLANT
GLOVE BIO SURGEON STRL SZ 6.5 (GLOVE) ×2 IMPLANT
GLOVE BIOGEL M STRL SZ7.5 (GLOVE) ×2 IMPLANT
GLOVE BIOGEL PI IND STRL 8 (GLOVE) ×1 IMPLANT
GLOVE BIOGEL PI INDICATOR 8 (GLOVE) ×1
GLOVE ECLIPSE 7.0 STRL STRAW (GLOVE) ×2 IMPLANT
GOWN PREVENTION PLUS XLARGE (GOWN DISPOSABLE) ×3 IMPLANT
GOWN PREVENTION PLUS XXLARGE (GOWN DISPOSABLE) ×3 IMPLANT
NDL HYPO 25X1 1.5 SAFETY (NEEDLE) ×1 IMPLANT
NDL SPNL 25GX3.5 QUINCKE BL (NEEDLE) IMPLANT
NEEDLE HYPO 25X1 1.5 SAFETY (NEEDLE) IMPLANT
NEEDLE SPNL 25GX3.5 QUINCKE BL (NEEDLE) ×2 IMPLANT
PACK BASIN DAY SURGERY FS (CUSTOM PROCEDURE TRAY) ×2 IMPLANT
PAD CLEANER CAUTERY TIP 5X5 (MISCELLANEOUS)
PENCIL BUTTON HOLSTER BLD 10FT (ELECTRODE) ×1 IMPLANT
SHEET MEDIUM DRAPE 40X70 STRL (DRAPES) ×1 IMPLANT
SHEETING SILICONE GEL EPI DERM (MISCELLANEOUS) IMPLANT
SLEEVE SCD COMPRESS KNEE MED (MISCELLANEOUS) ×2 IMPLANT
SPONGE GAUZE 4X4 12PLY (GAUZE/BANDAGES/DRESSINGS) IMPLANT
SPONGE LAP 18X18 X RAY DECT (DISPOSABLE) ×2 IMPLANT
STAPLER VISISTAT 35W (STAPLE) IMPLANT
STOCKINETTE 4X48 STRL (DRAPES) IMPLANT
STRIP CLOSURE SKIN 1/2X4 (GAUZE/BANDAGES/DRESSINGS) IMPLANT
STRIP SUTURE WOUND CLOSURE 1/2 (SUTURE) ×1 IMPLANT
SUCTION FRAZIER TIP 10 FR DISP (SUCTIONS) ×1 IMPLANT
SUT MNCRL AB 3-0 PS2 18 (SUTURE) IMPLANT
SUT MON AB 2-0 CT1 36 (SUTURE) ×1 IMPLANT
SUT PROLENE 4 0 P 3 18 (SUTURE) IMPLANT
SUT PROLENE 4 0 PS 2 18 (SUTURE) IMPLANT
SUT SILK 3 0 PS 1 (SUTURE) IMPLANT
SYR CONTROL 10ML LL (SYRINGE) ×2 IMPLANT
TAPE HYPAFIX 6X30 (GAUZE/BANDAGES/DRESSINGS) IMPLANT
TOWEL OR 17X24 6PK STRL BLUE (TOWEL DISPOSABLE) ×3 IMPLANT
TRAY DSU PREP LF (CUSTOM PROCEDURE TRAY) ×2 IMPLANT
TUBE CONNECTING 20X1/4 (TUBING) ×1 IMPLANT
UNDERPAD 30X30 INCONTINENT (UNDERPADS AND DIAPERS) IMPLANT
VAC PENCILS W/TUBING CLEAR (MISCELLANEOUS) ×1 IMPLANT
YANKAUER SUCT BULB TIP NO VENT (SUCTIONS) ×1 IMPLANT

## 2012-12-23 NOTE — H&P (Signed)
Christina [name] is an 32 y.o. female.   Chief Complaint: Increased keloidosis right face HPI: Increasede pain and discomfort with intense itching  Past Medical History  Diagnosis Date  . Morbid obesity   . Hypertrophic condition of skin     Right/Left side of face  . Anemia   . History of radiation therapy 09/10/2012-09/18/2012    left facial keloid    Past Surgical History  Procedure Laterality Date  . No surgical history    . Mass excision Left 09/09/2012    Procedure: EXCISION KELOIDS LEFT FACE WITH FLAP RECONSTRUCTION;  Surgeon: Louisa Second, MD;  Location: [facility];  Service: Plastics;  Laterality: Left;  . Steriod injection Bilateral 09/09/2012    Procedure: STEROID INJECTION;  Surgeon: Louisa Second, MD;  Location:  SURGERY CENTER;  Service: Plastics;  Laterality: Bilateral;    History reviewed. No pertinent family history. Social History:  reports that she has never smoked. She does not have any smokeless tobacco history on file. She reports that  drinks alcohol. She reports that she does not use illicit drugs.  Allergies: No Known Allergies  No prescriptions prior to admission    No results found for this or any previous visit (from the past 48 hour(s)). No results found.  Review of Systems  Constitutional: Negative.   HENT: Negative.   Eyes: Negative.   Respiratory: Negative.   Cardiovascular: Negative.   Gastrointestinal: Negative.   Genitourinary: Negative.   Musculoskeletal: Negative.   Neurological: Negative.   Endo/Heme/Allergies: Negative.   Psychiatric/Behavioral: Negative.     Blood pressure 110/72, pulse 79, temperature 98.1 F (36.7 C), temperature source Oral, resp. rate 18, height 5\' 3"  (1.6 m), weight 170.269 kg (375 lb 6 oz), last menstrual period 12/02/2012, SpO2 100.00%. Physical Exam   Assessment/Plan Severe keloidosis right face for excision and plastic reconstruction and post op low dose  irradiation  Reginald Weida L 12/23/2012, 7:31 AM

## 2012-12-23 NOTE — Op Note (Signed)
NAMEARDIS, LAWLEY NO.:  [id number]  MEDICAL RECORD NO.:  [id number]  LOCATION:                                 FACILITY:  PHYSICIAN:  Earvin Hansen L. Shon Hough, M.D.DATE OF BIRTH:  09/24/1980  DATE OF PROCEDURE:  12/23/2012 DATE OF DISCHARGE:  12/23/2012                              OPERATIVE REPORT   A 32-year-lady with severe acute keloidosis involving her right face x2 complexes.  She has had intensive conservative treatment without any improvement.  She has had reconstruction on the left side of the face done previously.  She has not been prepared for reconstruction of excision of the keloidosis on the right side of the face.  All the procedures in detail, as well as attendant risks, and potential recurrence and other complications were explained to the patient preoperatively.  The patient consents for surgery.  PROCEDURE:  She was taken to the operating room, placed on operating room table in the supine position, was given adequate general anesthesia, intubated orally without difficulty.  Prep was done to the face with Betadine solution thoroughly.  Eyes were protected with [place]- Lube walled off with sterile towels and drapes so as to make a sterile field.  The 2 chemo doses areas were outlined and then they were drawn with a marking pen and the area of the vector of the face.  An 0.5% Xylocaine with epinephrine was injected locally and 1:200,00 concentration, a total of 30 mL.  This was allowed to set up for vasoconstriction tape placed and then each complex area was then excised longitudinally in an elliptical fashion with a 15 blade down through skin and subcutaneous tissue.  Hemostasis was maintained with the Bovie anticoagulation.  Next, a superior and inferior flaps on both areas were elevated significantly.  Approximately 2-1/2 to 3 cm to allow a rotational flap reconstruction of each area with deep sutures of 2-0 Monocryl.  Subcutaneous sutures  of 3-0 Monocryl, subdermal suture of 3-0 Monocryl in a running subcuticular stitch of 3-0 Monocryl.  This was carried out on both sites.  After this, I was happy with that reconstruction.  Steri-Strips and soft dressing applied on all the areas, Xeroform gauze, 4x4s, Hypafix tape.  She tolerated all the procedures very well.  ESTIMATED BLOOD LOSS:  Less than 50 mL.  COMPLICATIONS:  None.     Yaakov Guthrie. Shon Hough, M.D.     Cathie Hoops  D:  12/23/2012  T:  12/23/2012  Job:  [id number]

## 2012-12-23 NOTE — Anesthesia Procedure Notes (Signed)
Procedure Name: Intubation Date/Time: 12/23/2012 7:44 AM Performed by: Burna Cash Pre-anesthesia Checklist: Patient identified, Emergency Drugs available, Suction available and Patient being monitored Patient Re-evaluated:Patient Re-evaluated prior to inductionOxygen Delivery Method: Circle System Utilized Preoxygenation: Pre-oxygenation with 100% oxygen Intubation Type: IV induction Ventilation: Mask ventilation without difficulty Laryngoscope Size: Mac and 3 Grade View: Grade I Tube type: Oral Number of attempts: 1 Airway Equipment and Method: stylet and oral airway Placement Confirmation: ETT inserted through vocal cords under direct vision,  positive ETCO2 and breath sounds checked- equal and bilateral Secured at: 22 cm Tube secured with: Tape Dental Injury: Teeth and Oropharynx as per pre-operative assessment

## 2012-12-23 NOTE — Anesthesia Preprocedure Evaluation (Addendum)
Anesthesia Evaluation  Patient identified by MRN, date of birth, ID band Patient awake    Reviewed: Allergy & Precautions, H&P , NPO status , Patient's Chart, lab work & pertinent test results  History of Anesthesia Complications Negative for: history of anesthetic complications  Airway       Dental   Pulmonary          Cardiovascular     Neuro/Psych    GI/Hepatic   Endo/Other    Renal/GU      Musculoskeletal   Abdominal   Peds  Hematology   Anesthesia Other Findings   Reproductive/Obstetrics                          Anesthesia Physical Anesthesia Plan Anesthesia Quick Evaluation

## 2012-12-23 NOTE — Brief Op Note (Signed)
12/23/2012  8:38 AM  PATIENT:  Christina [name]  32 y.o. female  PRE-OPERATIVE DIAGNOSIS:  Neoplasm of uncertain behavior of skin   POST-OPERATIVE DIAGNOSIS:  Neoplasm of uncertain behavior of skin   PROCEDURE:  Procedure(s): EXCISION OF SEVERE RIGHT FACE KELOIDOSIS WITH LARGE FLAP RECONSTRUCTION (Right)  SURGEON:  Surgeon(s) and Role:    * Louisa Second, MD - Primary  PHYSICIAN ASSISTANT:   ASSISTANTS: none   ANESTHESIA:   general  EBL:  Total I/O In: 200 [I.V.:200] Out: -   BLOOD ADMINISTERED:none  DRAINS: none   LOCAL MEDICATIONS USED:  LIDOCAINE   SPECIMEN:  Excision  DISPOSITION OF SPECIMEN:  PATHOLOGY  COUNTS:  YES  TOURNIQUET:  * No tourniquets in log *  DICTATION: .Other Dictation: Dictation Number [id number]  PLAN OF CARE: Discharge to home after PACU  PATIENT DISPOSITION:  PACU - hemodynamically stable.   Delay start of Pharmacological VTE agent (>24hrs) due to surgical blood loss or risk of bleeding: yes

## 2012-12-23 NOTE — Anesthesia Postprocedure Evaluation (Signed)
Anesthesia Post Note  Patient: Christina [name]  Procedure(s) Performed: Procedure(s) (LRB): EXCISION OF SEVERE RIGHT FACE KELOIDOSIS WITH LARGE FLAP RECONSTRUCTION (Right)  Anesthesia type: general  Patient location: PACU  Post pain: Pain level controlled  Post assessment: Patient's Cardiovascular Status Stable  Post vital signs: Reviewed and stable  Level of consciousness: sedated  Complications: No apparent anesthesia complications

## 2012-12-23 NOTE — Transfer of Care (Signed)
Immediate Anesthesia Transfer of Care Note  Patient: Christina [name]  Procedure(s) Performed: Procedure(s): EXCISION OF SEVERE RIGHT FACE KELOIDOSIS WITH LARGE FLAP RECONSTRUCTION (Right)  Patient Location: PACU  Anesthesia Type:General  Level of Consciousness: awake, alert  and oriented  Airway & Oxygen Therapy: Patient Spontanous Breathing and Patient connected to face mask oxygen  Post-op Assessment: Report given to PACU RN and Post -op Vital signs reviewed and stable  Post vital signs: Reviewed and stable  Complications: No apparent anesthesia complications

## 2012-12-24 ENCOUNTER — Ambulatory Visit
Admission: RE | Admit: 2012-12-24 | Discharge: 2012-12-24 | Disposition: A | Discharge status: Home / Self Care | Payer: BC Managed Care – PPO | Source: Ambulatory Visit | Attending: Radiation Oncology | Admitting: Radiation Oncology

## 2012-12-24 ENCOUNTER — Encounter (HOSPITAL_BASED_OUTPATIENT_CLINIC_OR_DEPARTMENT_OTHER): Payer: Self-pay | Admitting: Specialist

## 2012-12-24 DIAGNOSIS — Z510 Encounter for antineoplastic radiation therapy: Secondary | ICD-10-CM | POA: Insufficient documentation

## 2012-12-24 DIAGNOSIS — L910 Hypertrophic scar: Secondary | ICD-10-CM | POA: Insufficient documentation

## 2012-12-24 NOTE — Progress Notes (Signed)
Christina [name] here for a dressing to be put on her right check.  Her steri strips are intact without any drainage.  Applied Telfa nonadherent pads and secured with tape.

## 2012-12-24 NOTE — Progress Notes (Signed)
Christina [name] came to the clinic to have a bandage put on her right check.  She has steri strips intact along her right check.  There is not any drainage present.  Nonadherent Telfa pads were placed on her right check and secured with paper tape.

## 2012-12-26 ENCOUNTER — Ambulatory Visit
Admission: RE | Admit: 2012-12-26 | Discharge: 2012-12-26 | Disposition: A | Discharge status: Home / Self Care | Payer: BC Managed Care – PPO | Source: Ambulatory Visit | Attending: Radiation Oncology | Admitting: Radiation Oncology

## 2012-12-26 DIAGNOSIS — L910 Hypertrophic scar: Secondary | ICD-10-CM

## 2012-12-26 NOTE — Progress Notes (Signed)
   Department of Radiation Oncology  Phone:  [phone] Fax:        [phone]  Weekly Treatment Note    Name: Reality Dejonge Date: 12/26/2012 MRN: [id number] DOB: [birth year]   Current dose: 8 Gy  Current fraction: 2   MEDICATIONS: No current outpatient prescriptions on file.   No current facility-administered medications for this encounter.     ALLERGIES: Review of patient's allergies indicates no known allergies.   LABORATORY DATA:  Lab Results  Component Value Date   HGB 10.0* 12/23/2012   No results found for this basename: NA, K, CL, CO2   No results found for this basename: ALT, AST, GGT, ALKPHOS, BILITOT     NARRATIVE: Shalene Gallen was seen today for weekly treatment management. The chart was checked and the patient's films were reviewed. The patient is doing very well. Some soreness from surgery but otherwise no complaints. No problems with her radiation treatment so far.  PHYSICAL EXAMINATION:   Surgical area looks good. No infection. No radiation change.      ASSESSMENT: The patient is doing satisfactorily with treatment.  PLAN: We will continue with the patient's radiation treatment as planned.

## 2012-12-26 NOTE — Progress Notes (Signed)
Lorina Rabon here for a dressing for her right cheek incision.  The incision has intact steri strips without any drainage.  A nonadherent Telfa was placed over the incision and secured with medapore tape.

## 2012-12-30 ENCOUNTER — Ambulatory Visit
Admission: RE | Admit: 2012-12-30 | Discharge: 2012-12-30 | Disposition: A | Discharge status: Home / Self Care | Payer: BC Managed Care – PPO | Source: Ambulatory Visit | Attending: Radiation Oncology | Admitting: Radiation Oncology

## 2013-01-01 ENCOUNTER — Ambulatory Visit
Admission: RE | Admit: 2013-01-01 | Discharge: 2013-01-01 | Disposition: A | Discharge status: Home / Self Care | Payer: BC Managed Care – PPO | Source: Ambulatory Visit | Attending: Radiation Oncology | Admitting: Radiation Oncology

## 2013-01-01 ENCOUNTER — Encounter: Payer: Self-pay | Admitting: Radiation Oncology

## 2013-01-01 ENCOUNTER — Ambulatory Visit: Payer: BC Managed Care – PPO | Admitting: Radiation Oncology

## 2013-01-01 VITALS — BP 125/86 | HR 96 | Temp 98.2°F | Resp 20 | Wt 380.8 lb

## 2013-01-01 DIAGNOSIS — L910 Hypertrophic scar: Secondary | ICD-10-CM

## 2013-01-01 NOTE — Progress Notes (Signed)
Pt completed treatment to keloids bilateral face. Steri strips intact on right cheek. Pt states she bandages these areas herself. She denies pain. Gave pt FU card.

## 2013-01-17 NOTE — Progress Notes (Signed)
  Radiation Oncology         [phone]) [phone] ________________________________  Name: Christina [name] MRN: [id number]  Date: 01/01/2013  DOB: Feb 18, 1981  End of Treatment Note  Diagnosis:   Keloid involving right face     Indication for treatment:  curative       Radiation treatment dates:   12/24/12 - 01/01/13  Site/dose:   The patient received 16 Gy in 4 fractions every other day to the postoperative region in the right face. This involved an en face electron field using 6 MeV electrons  Narrative: The patient tolerated radiation treatment relatively well.   The scar looked good with no acute skin irritation.  Plan: The patient has completed radiation treatment. The patient will return to radiation oncology clinic for routine followup in one month. I advised the patient to call or return sooner if they have any questions or concerns related to their recovery or treatment. ________________________________  Radene Gunning, M.D., Ph.D.

## 2013-01-17 NOTE — Progress Notes (Signed)
  Radiation Oncology         [phone] ________________________________  Name: Christina [name] MRN: [id number]  Date: 12/24/2012  DOB: 1981/04/25  SIMULATION AND TREATMENT PLANNING NOTE  DIAGNOSIS:  Keloid right face  NARRATIVE:  The patient was brought to the treatment suite.  Identity was confirmed.  All relevant records and images related to the planned course of therapy were reviewed.   Written consent to proceed with treatment was confirmed which was freely given after reviewing the details related to the planned course of therapy had been reviewed with the patient.  Then, the patient was set-up in a stable reproducible  supine position for radiation therapy.  Surface markings were placed to identify the treatment area and to delineate a customized electron field.    The radiation prescription was entered and confirmed.  A total of 1 complex treatment devices were fabricated which relate to the designed radiation treatment fields. Each of these customized fields/ complex treatment devices will be used on a daily basis during the radiation course. I have requested : special port plan.   PLAN:  The patient will receive 16 Gy in 4 fractions on an every other day basis.  ________________________________   Radene Gunning, MD, PhD

## 2013-02-05 ENCOUNTER — Encounter: Payer: Self-pay | Admitting: Oncology

## 2013-02-06 ENCOUNTER — Ambulatory Visit: Payer: BC Managed Care – PPO | Attending: Radiation Oncology | Admitting: Radiation Oncology

## 2013-03-10 ENCOUNTER — Telehealth: Payer: Self-pay | Admitting: *Deleted

## 2013-03-10 NOTE — Telephone Encounter (Signed)
Tried to reschedule missed follow up appointment with Dr. Mitzi Hansen for 02/06/13.  Patient's phone number no longer correct.  Called patient's mother no answer.

## 2013-03-12 ENCOUNTER — Encounter: Payer: Self-pay | Admitting: *Deleted

## 2013-04-07 ENCOUNTER — Emergency Department (HOSPITAL_COMMUNITY): Payer: BC Managed Care – PPO

## 2013-04-07 ENCOUNTER — Emergency Department (HOSPITAL_COMMUNITY)
Admission: EM | Admit: 2013-04-07 | Discharge: 2013-04-07 | Disposition: A | Discharge status: Home / Self Care | Payer: BC Managed Care – PPO | Attending: Emergency Medicine | Admitting: Emergency Medicine

## 2013-04-07 ENCOUNTER — Encounter (HOSPITAL_COMMUNITY): Payer: Self-pay | Admitting: *Deleted

## 2013-04-07 DIAGNOSIS — R0602 Shortness of breath: Secondary | ICD-10-CM | POA: Insufficient documentation

## 2013-04-07 DIAGNOSIS — Z3202 Encounter for pregnancy test, result negative: Secondary | ICD-10-CM | POA: Insufficient documentation

## 2013-04-07 DIAGNOSIS — R109 Unspecified abdominal pain: Secondary | ICD-10-CM | POA: Insufficient documentation

## 2013-04-07 DIAGNOSIS — R0789 Other chest pain: Secondary | ICD-10-CM

## 2013-04-07 DIAGNOSIS — F419 Anxiety disorder, unspecified: Secondary | ICD-10-CM

## 2013-04-07 DIAGNOSIS — Z862 Personal history of diseases of the blood and blood-forming organs and certain disorders involving the immune mechanism: Secondary | ICD-10-CM | POA: Insufficient documentation

## 2013-04-07 DIAGNOSIS — F410 Panic disorder [episodic paroxysmal anxiety] without agoraphobia: Secondary | ICD-10-CM

## 2013-04-07 DIAGNOSIS — Z923 Personal history of irradiation: Secondary | ICD-10-CM | POA: Insufficient documentation

## 2013-04-07 DIAGNOSIS — R079 Chest pain, unspecified: Secondary | ICD-10-CM

## 2013-04-07 DIAGNOSIS — Z872 Personal history of diseases of the skin and subcutaneous tissue: Secondary | ICD-10-CM | POA: Insufficient documentation

## 2013-04-07 DIAGNOSIS — R071 Chest pain on breathing: Secondary | ICD-10-CM | POA: Insufficient documentation

## 2013-04-07 LAB — D-DIMER, QUANTITATIVE: D-Dimer, Quant: 0.29 ug/mL-FEU (ref 0.00–0.48)

## 2013-04-07 LAB — BASIC METABOLIC PANEL
CO2: 27 mEq/L (ref 19–32)
Chloride: 101 mEq/L (ref 96–112)
Glucose, Bld: 83 mg/dL (ref 70–99)
Potassium: 4.3 mEq/L (ref 3.5–5.1)
Sodium: 137 mEq/L (ref 135–145)

## 2013-04-07 LAB — CBC
HCT: 33.2 % — ABNORMAL LOW (ref 36.0–46.0)
Hemoglobin: 10.6 g/dL — ABNORMAL LOW (ref 12.0–15.0)
RBC: 4.42 MIL/uL (ref 3.87–5.11)

## 2013-04-07 LAB — POCT PREGNANCY, URINE: Preg Test, Ur: NEGATIVE

## 2013-04-07 LAB — POCT I-STAT TROPONIN I

## 2013-04-07 MED ORDER — LORAZEPAM 1 MG PO TABS: 1.0000 mg | ORAL_TABLET | Freq: Three times a day (TID) | ORAL | Status: AC | PRN

## 2013-04-07 NOTE — ED Provider Notes (Signed)
CSN: [id number]     Arrival date & time 04/07/13  1329 History   First MD Initiated Contact with Patient 04/07/13 1718     Chief Complaint  Patient presents with  . Abdominal Cramping  . Shortness of Breath  . Palpitations   (Consider location/radiation/quality/duration/timing/severity/associated sxs/prior Treatment) HPI Comments: Morbidly obese female with a past medical history of anemia presents to the emergency department complaining of sudden onset chest pain beginning around 3 clock this morning that woke her up from sleep. Patient states she woke up with sharp pains in the Center of her chest with associated sensation of her heart beating out of her chest and shortness of breath that lasted about 5-10 minutes. She then fell back to sleep, woke up and went to work, and around 12:00 noon the pain returned, however this time lasting a few hours through while she was sitting in the waiting room at the emergency department. She describes the pain as sharp, located in the center of her chest rated 6/10. She has not tried any alleviating factors for her symptoms. States she is under a lot of increased stress with her job, and states her job is very stressful. She works at [employer] and people are constantly yelling at her. Currently she is asymptomatic. States she had chest pain like this in the past and was told that she had chest wall pain. Denies fever, chills, nausea, vomiting. Also states she has lower abdominal cramping which she normally has with her menstrual cycle. She is on day 2 of her menstrual cycle and is normal. The abdominal cramping she is having is no different than normal for her. Denies increased urinary frequency, urgency or dysuria. She is not on any oral contraceptive pills, nonsmoker. No family history of early heart disease or blood clots.  Patient is a 32 y.o. female presenting with cramps, shortness of breath, and palpitations. The history is provided by the patient.    Abdominal Cramping Associated symptoms include abdominal pain and chest pain. Pertinent negatives include no chills, diaphoresis, fever, nausea or vomiting.  Shortness of Breath Associated symptoms: abdominal pain and chest pain   Associated symptoms: no diaphoresis, no fever, no vomiting and no wheezing   Palpitations Associated symptoms: chest pain and shortness of breath   Associated symptoms: no diaphoresis, no nausea and no vomiting     Past Medical History  Diagnosis Date  . Morbid obesity   . Hypertrophic condition of skin     Right/Left side of face  . Anemia   . History of radiation therapy 09/10/2012-09/18/2012    left facial keloid  . History of radiation therapy 12/24/2012-01/01/2013    right facial   Past Surgical History  Procedure Laterality Date  . No surgical history    . Mass excision Left 09/09/2012    Procedure: EXCISION KELOIDS LEFT FACE WITH FLAP RECONSTRUCTION;  Surgeon: Louisa Second, MD;  Location: [facility];  Service: Plastics;  Laterality: Left;  . Steriod injection Bilateral 09/09/2012    Procedure: STEROID INJECTION;  Surgeon: Louisa Second, MD;  Location: [facility];  Service: Plastics;  Laterality: Bilateral;  . Mass excision Right 12/23/2012    Procedure: EXCISION OF SEVERE RIGHT FACE KELOIDOSIS WITH LARGE FLAP RECONSTRUCTION;  Surgeon: Louisa Second, MD;  Location: [facility];  Service: Plastics;  Laterality: Right;   No family history on file. History  Substance Use Topics  . Smoking status: Never Smoker   . Smokeless tobacco: Not  on file  . Alcohol Use: Yes     Comment: social   OB History   Grav Para Term Preterm Abortions TAB SAB Ect Mult Living                 Review of Systems  Constitutional: Negative for fever, chills, diaphoresis and activity change.  Respiratory: Positive for shortness of breath. Negative for wheezing.   Cardiovascular: Positive for chest pain and palpitations.   Gastrointestinal: Positive for abdominal pain. Negative for nausea and vomiting.  Genitourinary: Negative for menstrual problem.  All other systems reviewed and are negative.    Allergies  Review of patient's allergies indicates no known allergies.  Home Medications  No current outpatient prescriptions on file. BP 144/94  Pulse 70  Temp(Src) 98.4 F (36.9 C) (Oral)  Resp 22  Wt 375 lb 8 oz (170.326 kg)  BMI 66.53 kg/m2  SpO2 100%  LMP 04/05/2013 Physical Exam  Nursing note and vitals reviewed. Constitutional: She is oriented to person, place, and time. She appears well-developed. No distress.  Morbidly obese  HENT:  Head: Normocephalic and atraumatic.  Mouth/Throat: Oropharynx is clear and moist.  Eyes: Conjunctivae and EOM are normal. Pupils are equal, round, and reactive to light.  Neck: Normal range of motion. Neck supple. No JVD present.  Cardiovascular: Normal rate, regular rhythm, normal heart sounds and intact distal pulses.   Pulmonary/Chest: Effort normal and breath sounds normal. No respiratory distress. She has no wheezes. She has no rales. She exhibits tenderness.    Abdominal: Soft. Bowel sounds are normal. She exhibits no distension. There is tenderness (very mild) in the suprapubic area. There is no rigidity, no rebound and no guarding.  Musculoskeletal: Normal range of motion. She exhibits no edema.  Neurological: She is alert and oriented to person, place, and time.  Skin: Skin is warm and dry. She is not diaphoretic. No pallor.  Psychiatric: Her behavior is normal.  Anxious, tearful.    ED Course  Procedures (including critical care time) Labs Review Labs Reviewed  CBC - Abnormal; Notable for the following:    Hemoglobin 10.6 (*)    HCT 33.2 (*)    MCV 75.1 (*)    MCH 24.0 (*)    Platelets 401 (*)    All other components within normal limits  BASIC METABOLIC PANEL  D-DIMER, QUANTITATIVE  POCT I-STAT TROPONIN I  POCT PREGNANCY, URINE     Date: 04/07/2013  Rate: 73  Rhythm: normal sinus rhythm  QRS Axis: normal  Intervals: normal  ST/T Wave abnormalities: nonspecific T wave changes anterior  Conduction Disutrbances:none  Narrative Interpretation: inverted T-waves leads v1-v3  Old EKG Reviewed: none available   Imaging Review Dg Chest 2 View  04/07/2013   CLINICAL DATA:  Abnormal cramping. Shortness of breath. Palpitations.  EXAM: CHEST  2 VIEW  COMPARISON:  None.  FINDINGS: Heart size is normal. The lungs are clear. The visualized soft tissues and bony thorax are unremarkable.  IMPRESSION: No active cardiopulmonary disease.   Electronically Signed   By: Gennette Pac M.D.   On: 04/07/2013 14:25    MDM   1. Panic attack   2. Anxiety   3. Chest pain   4. Chest wall pain     Patient with chest pain, reproducible. She is very anxious. Labs obtained in triage prior to patient being seen, CBC, BMP, troponin. Labs normal, hemoglobin stable for patient. Chest x-ray clear. EKG with T-wave inversions in V1, V2 and V3. Given she  is morbidly obese, will obtain a d-dimer even tho she is PERC negative. I do feel her chest pain is from anxiety and a panic attack. If d-dimer normal, plan is to discharge home and care resources for PCP followup. She is well appearing and in no apparent distress with normal vital signs. Doubt CAD. Patient is refusing anything for anxiety at this time. Patient also evaluated by my attending Dr. Hyacinth Meeker who agrees with plan of care. 7:13 PM D-dimer within normal limits. She is stable for discharge home. Ativan given for severe anxiety. Information on the wellness clinic for PCP followup discussed. Return precautions discussed. Patient states understanding of treatment care plan and is agreeable.    Trevor Mace, PA-C 04/07/13 1914

## 2013-04-07 NOTE — ED Provider Notes (Signed)
Medical screening examination/treatment/procedure(s) were conducted as a shared visit with non-physician practitioner(s) and myself.  I personally evaluated the patient during the encounter.  Pt with increased SOB and palpitations intermittently throughout day - she has also had some cramping from her menses.  She has soft abd, clear heart adn lungs sounds with normal labs including d dimer.  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  Stable for d/c.  Clinical Impression: SOB, palpitations      Vida Roller, MD 04/07/13 2053

## 2013-04-07 NOTE — ED Notes (Signed)
Pt states last nite woke up at 3am  With heart beating and was hard for her to catch her breath.  Pt complains of sob.  Pt is lower abdominal cramping from menstrual.

## 2013-04-07 NOTE — ED Notes (Signed)
Pt states she began having left sided cp that started at 3am and then eased off, pt states they came back when she was at work and she also has some menstrual cramping. Pt states she was feeling like she couldn't breath. Pt states she has a stressful work environment. Pt states palpation makes pain worse.

## 2016-03-01 ENCOUNTER — Emergency Department (HOSPITAL_COMMUNITY)
Admission: EM | Admit: 2016-03-01 | Discharge: 2016-03-01 | Disposition: A | Discharge status: Home / Self Care | Payer: 59 | Attending: Emergency Medicine | Admitting: Emergency Medicine

## 2016-03-01 ENCOUNTER — Emergency Department (HOSPITAL_COMMUNITY): Payer: 59

## 2016-03-01 ENCOUNTER — Encounter (HOSPITAL_COMMUNITY): Payer: Self-pay | Admitting: Nurse Practitioner

## 2016-03-01 DIAGNOSIS — M542 Cervicalgia: Secondary | ICD-10-CM | POA: Diagnosis present

## 2016-03-01 DIAGNOSIS — M25571 Pain in right ankle and joints of right foot: Secondary | ICD-10-CM | POA: Insufficient documentation

## 2016-03-01 DIAGNOSIS — Y9389 Activity, other specified: Secondary | ICD-10-CM | POA: Diagnosis not present

## 2016-03-01 DIAGNOSIS — M25562 Pain in left knee: Secondary | ICD-10-CM | POA: Insufficient documentation

## 2016-03-01 DIAGNOSIS — M25512 Pain in left shoulder: Secondary | ICD-10-CM | POA: Diagnosis not present

## 2016-03-01 DIAGNOSIS — M549 Dorsalgia, unspecified: Secondary | ICD-10-CM | POA: Diagnosis not present

## 2016-03-01 DIAGNOSIS — Y92411 Interstate highway as the place of occurrence of the external cause: Secondary | ICD-10-CM | POA: Insufficient documentation

## 2016-03-01 DIAGNOSIS — R079 Chest pain, unspecified: Secondary | ICD-10-CM | POA: Diagnosis not present

## 2016-03-01 DIAGNOSIS — Y999 Unspecified external cause status: Secondary | ICD-10-CM | POA: Diagnosis not present

## 2016-03-01 MED ORDER — DIAZEPAM 5 MG PO TABS: 5.0000 mg | ORAL_TABLET | Freq: Two times a day (BID) | ORAL | 0 refills | Status: AC

## 2016-03-01 MED ORDER — OXYCODONE-ACETAMINOPHEN 5-325 MG PO TABS: 1.0000 | ORAL_TABLET | ORAL | 0 refills | Status: AC | PRN

## 2016-03-01 MED ORDER — OXYCODONE-ACETAMINOPHEN 5-325 MG PO TABS
1.0000 | ORAL_TABLET | Freq: Once | ORAL | Status: AC
  Administered 2016-03-01: 1 via ORAL
  Filled 2016-03-01: qty 1

## 2016-03-01 MED ORDER — IBUPROFEN 600 MG PO TABS: 600.0000 mg | ORAL_TABLET | Freq: Four times a day (QID) | ORAL | 0 refills | Status: AC | PRN

## 2016-03-01 MED ORDER — DIAZEPAM 5 MG PO TABS
5.0000 mg | ORAL_TABLET | Freq: Once | ORAL | Status: AC
  Administered 2016-03-01: 5 mg via ORAL
  Filled 2016-03-01: qty 1
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
## 2016-03-01 NOTE — Discharge Instructions (Signed)
Take your medications as prescribed as an for pain relief. I also recommend applying ice to affected areas for 15-20 minutes 3-4 times daily to help with pain and swelling. Follow-up with her family doctor in the next week as needed. Please return to the Emergency Department if symptoms worsen or new onset of fever, numbness, tingling, groin anesthesia, abdominal pain, vomiting, chest pain, difficulty breathing, loss of bowel or bladder, weakness.
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
## 2017-12-13 ENCOUNTER — Other Ambulatory Visit: Payer: Self-pay

## 2017-12-13 ENCOUNTER — Encounter (HOSPITAL_COMMUNITY): Payer: Self-pay | Admitting: Emergency Medicine

## 2017-12-13 ENCOUNTER — Emergency Department (HOSPITAL_COMMUNITY)
Admission: EM | Admit: 2017-12-13 | Discharge: 2017-12-13 | Disposition: A | Discharge status: Home / Self Care | Payer: 59 | Attending: Emergency Medicine | Admitting: Emergency Medicine

## 2017-12-13 DIAGNOSIS — R197 Diarrhea, unspecified: Secondary | ICD-10-CM | POA: Insufficient documentation

## 2017-12-13 DIAGNOSIS — Z79899 Other long term (current) drug therapy: Secondary | ICD-10-CM | POA: Insufficient documentation

## 2017-12-13 DIAGNOSIS — R112 Nausea with vomiting, unspecified: Secondary | ICD-10-CM

## 2017-12-13 LAB — CBC
HEMATOCRIT: 39.7 % (ref 36.0–46.0)
Hemoglobin: 11.8 g/dL — ABNORMAL LOW (ref 12.0–15.0)
MCH: 24.1 pg — AB (ref 26.0–34.0)
MCHC: 29.7 g/dL — ABNORMAL LOW (ref 30.0–36.0)
MCV: 81 fL (ref 78.0–100.0)
Platelets: 438 10*3/uL — ABNORMAL HIGH (ref 150–400)
RBC: 4.9 MIL/uL (ref 3.87–5.11)
RDW: 14.1 % (ref 11.5–15.5)
WBC: 6.1 10*3/uL (ref 4.0–10.5)

## 2017-12-13 LAB — COMPREHENSIVE METABOLIC PANEL
ALT: 18 U/L (ref 14–54)
AST: 24 U/L (ref 15–41)
Albumin: 3.3 g/dL — ABNORMAL LOW (ref 3.5–5.0)
Alkaline Phosphatase: 84 U/L (ref 38–126)
Anion gap: 7 (ref 5–15)
BUN: 12 mg/dL (ref 6–20)
CHLORIDE: 105 mmol/L (ref 101–111)
CO2: 27 mmol/L (ref 22–32)
Calcium: 8.8 mg/dL — ABNORMAL LOW (ref 8.9–10.3)
Creatinine, Ser: 0.87 mg/dL (ref 0.44–1.00)
GFR calc non Af Amer: >60 mL/min (ref >60)
Glucose, Bld: 100 mg/dL — ABNORMAL HIGH (ref 65–99)
POTASSIUM: 4.5 mmol/L (ref 3.5–5.1)
SODIUM: 139 mmol/L (ref 135–145)
Total Bilirubin: 0.5 mg/dL (ref 0.3–1.2)
Total Protein: 8 g/dL (ref 6.5–8.1)

## 2017-12-13 LAB — LIPASE, BLOOD: LIPASE: 35 U/L (ref 11–51)

## 2017-12-13 MED ORDER — ONDANSETRON 4 MG PO TBDP
4.0000 mg | ORAL_TABLET | Freq: Once | ORAL | Status: AC
  Administered 2017-12-13: 4 mg via ORAL
  Filled 2017-12-13: qty 1

## 2017-12-13 MED ORDER — ONDANSETRON 4 MG PO TBDP
4.0000 mg | ORAL_TABLET | Freq: Three times a day (TID) | ORAL | 0 refills | Status: AC | PRN
Start: 2017-12-13 — End: ?

## 2017-12-13 NOTE — ED Provider Notes (Signed)
[facility]   CSN: [id number] Arrival date & time: 12/13/17  0825     History   Chief Complaint Chief Complaint  Patient presents with  . Emesis  . Headache    HPI Christina [name] is a 37 y.o. female presenting today following 2 days of nausea vomiting and diarrhea.  She states that she has not been able to keep any food or water down for the last 2 days.  Patient denies blood in her stool or hematemesis.  Patient states that she last vomited yesterday at 6:30 AM, her last bout of diarrhea was at 7 AM this morning.  Associated symptoms patient states that she has some lower abdominal cramping whenever she tries to eat or drink.  Patient states that some of her coworkers have been having similar symptoms over the past few weeks.  Patient states that she felt warm on Tuesday but has not measured her temperature.  She is afebrile today. Patient states she has also had a mild headache for the past 2 days, however she states that her headache is her normal headache that she sees her primary care provider for and takes medicine.  She states that she does not have a headache at this time.  HPI  Past Medical History:  Diagnosis Date  . Anemia   . History of radiation therapy 09/10/2012-09/18/2012   left facial keloid  . History of radiation therapy 12/24/2012-01/01/2013   right facial  . Hypertrophic condition of skin    Right/Left side of face  . Morbid obesity [facility](HCC)     Patient Active Problem List   Diagnosis Date Noted  . 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 MASS EXCISION Left 09/09/2012   Procedure: EXCISION KELOIDS LEFT FACE WITH FLAP RECONSTRUCTION;  Surgeon: Louisa SecondGerald Truesdale, MD;  Location: [facility];  Service: Plastics;  Laterality: Left;  Marland Kitchen. MASS EXCISION Right 12/23/2012   Procedure: EXCISION OF SEVERE RIGHT FACE KELOIDOSIS WITH LARGE FLAP RECONSTRUCTION;  Surgeon:  Louisa SecondGerald Truesdale, MD;  Location: [facility];  Service: Plastics;  Laterality: Right;  . No surgical history    . STERIOD INJECTION Bilateral 09/09/2012   Procedure: STEROID INJECTION;  Surgeon: Louisa SecondGerald Truesdale, MD;  Location: [facility];  Service: Plastics;  Laterality: Bilateral;     OB History   None      Home Medications    Prior to Admission medications   Medication Sig Start Date End Date Taking? Authorizing Provider  diazepam (VALIUM) 5 MG tablet Take 1 tablet (5 mg total) by mouth 2 (two) times daily. 03/01/16   Barrett HenleNadeau, Nicole Elizabeth, PA-C  ibuprofen (ADVIL,MOTRIN) 600 MG tablet Take 1 tablet (600 mg total) by mouth every 6 (six) hours as needed. 03/01/16   Barrett HenleNadeau, Nicole Elizabeth, PA-C  LORazepam (ATIVAN) 1 MG tablet Take 1 tablet (1 mg total) by mouth 3 (three) times daily as needed for anxiety. Patient not taking: Reported on 03/01/2016 04/07/13   Kathrynn SpeedHess, Robyn M, PA-C  ondansetron (ZOFRAN ODT) 4 MG disintegrating tablet Take 1 tablet (4 mg total) by mouth every 8 (eight) hours as needed for nausea or vomiting. 12/13/17   Bill SalinasMorelli, Tyjah Hai A, PA-C  oxyCODONE-acetaminophen (PERCOCET/ROXICET) 5-325 MG tablet Take 1 tablet by mouth every 4 (four) hours as needed for severe pain. 03/01/16   Barrett HenleNadeau, Nicole Elizabeth, PA-C  phentermine (ADIPEX-P) 37.5 MG tablet Take 37.5 mg by mouth daily before breakfast.  [provider]    Family History No family history on file.  Social History Social History   Tobacco Use  . Smoking status: Never Smoker  . Smokeless tobacco: Never Used  Substance Use Topics  . Alcohol use: Yes    Comment: social  . Drug use: No     Allergies   Patient has no known allergies.   Review of Systems Review of Systems  Constitutional: Positive for fatigue. Negative for chills and diaphoresis.       Felt warm 2 days ago.  HENT: Negative.  Negative for rhinorrhea, sore throat and trouble swallowing.   Eyes:  Negative.   Respiratory: Negative.  Negative for cough and shortness of breath.   Cardiovascular: Negative.  Negative for chest pain.  Gastrointestinal: Positive for abdominal pain, diarrhea, nausea and vomiting. Negative for blood in stool.  Genitourinary: Negative.  Negative for difficulty urinating, dysuria, flank pain, hematuria, pelvic pain, vaginal bleeding and vaginal discharge.       Last menstrual period ended 2 weeks ago.  Musculoskeletal: Positive for myalgias.  Skin: Negative.  Negative for rash.  Neurological: Positive for headaches. Negative for dizziness, syncope, weakness, light-headedness and numbness.  Psychiatric/Behavioral: Negative.      Physical Exam Updated Vital Signs BP (!) 141/105 (BP Location: Right Arm)   Pulse 78   Temp 97.9 F (36.6 C) (Oral)   Resp 20   Ht 5\' 3"  (1.6 m)   Wt (!) 158.8 kg (350 lb)   LMP 11/29/2017 (Approximate)   SpO2 98%   BMI 62.00 kg/m   Physical Exam  Constitutional: She is oriented to person, place, and time. She appears well-developed and well-nourished.  Non-toxic appearance. She does not appear ill. No distress.  HENT:  Head: Normocephalic and atraumatic.  Mouth/Throat: Oropharynx is clear and moist.  Neck: Normal range of motion. Neck supple.  Cardiovascular: Normal rate, regular rhythm, normal heart sounds and intact distal pulses. Exam reveals no gallop and no friction rub.  No murmur heard. Pulmonary/Chest: Effort normal and breath sounds normal. No respiratory distress.  Abdominal: Soft. Bowel sounds are normal. There is no rigidity, no guarding, no CVA tenderness, no tenderness at McBurney's point and negative Murphy's sign.  Mild tenderness to the lower abdomen.  Musculoskeletal: Normal range of motion.  Neurological: She is alert and oriented to person, place, and time. She has normal strength. No cranial nerve deficit or sensory deficit.  Skin: Skin is warm and dry. She is not diaphoretic.  Psychiatric: She has a  normal mood and affect. Her behavior is normal.     ED Treatments / Results  Labs (all labs ordered are listed, but only abnormal results are displayed) Labs Reviewed  COMPREHENSIVE METABOLIC PANEL - Abnormal; Notable for the following components:      Result Value   Glucose, Bld 100 (*)    Calcium 8.8 (*)    Albumin 3.3 (*)    All other components within normal limits  CBC - Abnormal; Notable for the following components:   Hemoglobin 11.8 (*)    MCH 24.1 (*)    MCHC 29.7 (*)    Platelets 438 (*)    All other components within normal limits  LIPASE, BLOOD  URINALYSIS, ROUTINE W REFLEX MICROSCOPIC  I-STAT BETA HCG BLOOD, ED ([facility], [facility], [facility] ONLY)    EKG None  Radiology No results found.  Procedures Procedures (including critical care time)  Medications Ordered in ED Medications  ondansetron (ZOFRAN-ODT) disintegrating tablet 4 mg (4  mg Oral Given 12/13/17 1413)     Initial Impression / Assessment and Plan / ED Course  I have reviewed the triage vital signs and the nursing notes.  Pertinent labs & imaging results that were available during my care of the patient were reviewed by me and considered in my medical decision making (see chart for details).  Clinical Course as of Dec 13 1457  Thu Dec 13, 2017  1421 Patient states that she does not want to wait to have her pregnancy test or urinalysis to be done.  Patient states that she wishes to go home with something for nausea.  Patient informed of risks and given extensive abdominal pain return precautions by Dr. Madilyn Hook.   [BM]    Clinical Course User Index [BM] Bill Salinas, PA-C   Nausea/vomiting/diarrhea: Patient presenting with what is likely a viral illness for the past 2 days.  Patient states that she has not been able to keep any food or drink down for the past 2 days.  She states that many of her coworkers have had a similar illness over the past few weeks.  Patient is afebrile, not tachycardic, not tachypneic.  Urinary tract infection and ectopic were also considered however patient refused to provide a urinalysis or beta hCG today, she states that she just wants to go home and wants something for nausea.  Dr. Madilyn Hook spoke with patient and encouraged her to stay and complete her work-up but patient wants to leave, patient given extensive return precautions and states understanding of the return precautions.  Patient states she has also had a mild headache over the past 2 days, however she states that this is her normal headache and she is not currently having a headache at this time.  Patient given Zofran in department for nausea.  Patient given prescription for 2 days of Zofran.  Patient encouraged to return to the emergency department if her symptoms worsen or do not improve.  Patient given referral to community health and wellness for follow-up.  Final Clinical Impressions(s) / ED Diagnoses   Final diagnoses:  Nausea vomiting and diarrhea    ED Discharge Orders        Ordered    ondansetron (ZOFRAN ODT) 4 MG disintegrating tablet  Every 8 hours PRN     12/13/17 1440       Elizabeth Palau 12/13/17 1506    Tilden Fossa, MD 12/17/17 1147

## 2017-12-13 NOTE — ED Notes (Signed)
Pt refused DC vitals. Pt verbalized understanding of discharge instructions.

## 2017-12-13 NOTE — ED Triage Notes (Signed)
Pt reports vomiting, diarrhea, lowe back pain and a headache for the past 2 days, states she cannot even keep water down. Resp e/u, nad.

## 2017-12-13 NOTE — ED Notes (Signed)
ED Provider at bedside. 

## 2017-12-13 NOTE — Discharge Instructions (Addendum)
You did not stay to receive your urinalysis or pregnancy test today.  Please return if you have an increase in abdominal pain or any changes in urination.  Please follow-up with your primary care physician to follow-up on your care today.  Get help right away if: You have pain in your chest, neck, arm, or jaw. You feel very weak or you pass out (faint). You throw up again and again. You see blood in your throw-up. Your throw-up looks like black coffee grounds. You have bloody or black poop (stools) or poop that look like tar. You have a very bad headache, a stiff neck, or both. You have a rash. You have very bad pain, cramping, or bloating in your belly (abdomen). You have trouble breathing. You are breathing very quickly. Your heart is beating very quickly. Your skin feels cold and clammy. You feel confused. You have pain when you pee. Your pain does not go away as soon as your doctor says it should. You cannot stop throwing up. Your pain is only in areas of your belly, such as the right side or the left lower part of the belly. You have bloody or black poop, or poop that looks like tar. You have very bad pain, cramping, or bloating in your belly. You have signs of dehydration, such as: Dark pee, hardly any pee, or no pee. Cracked lips. Dry mouth. Sunken eyes. Sleepiness. Weakness.
